# Patient Record
Sex: Female | Born: 1970 | Race: Asian | Marital: Married | Smoking: Former smoker
Health system: Southern US, Community
[De-identification: ages and names within clinical notes are randomized; demographics above are authoritative.]

## PROBLEM LIST (undated history)

## (undated) HISTORY — PX: TUBAL LIGATION: SHX77

---

## 2016-02-23 ENCOUNTER — Encounter (HOSPITAL_COMMUNITY): Payer: Self-pay | Admitting: *Deleted

## 2016-02-24 ENCOUNTER — Other Ambulatory Visit (HOSPITAL_COMMUNITY): Payer: Self-pay | Admitting: *Deleted

## 2016-02-24 DIAGNOSIS — N6310 Unspecified lump in the right breast, unspecified quadrant: Secondary | ICD-10-CM

## 2016-02-24 DIAGNOSIS — N644 Mastodynia: Secondary | ICD-10-CM

## 2016-03-10 ENCOUNTER — Ambulatory Visit
Admission: RE | Admit: 2016-03-10 | Discharge: 2016-03-10 | Disposition: A | Discharge status: Home / Self Care | Payer: No Typology Code available for payment source | Source: Ambulatory Visit | Attending: Obstetrics and Gynecology | Admitting: Obstetrics and Gynecology

## 2016-03-10 ENCOUNTER — Encounter (HOSPITAL_COMMUNITY): Payer: Self-pay | Admitting: *Deleted

## 2016-03-10 ENCOUNTER — Ambulatory Visit (HOSPITAL_COMMUNITY)
Admission: RE | Admit: 2016-03-10 | Discharge: 2016-03-10 | Disposition: A | Discharge status: Home / Self Care | Payer: Self-pay | Source: Ambulatory Visit | Attending: Obstetrics and Gynecology | Admitting: Obstetrics and Gynecology

## 2016-03-10 VITALS — BP 118/86 | Temp 98.6°F | Ht 62.0 in | Wt 141.8 lb

## 2016-03-10 DIAGNOSIS — N644 Mastodynia: Secondary | ICD-10-CM

## 2016-03-10 DIAGNOSIS — N6310 Unspecified lump in the right breast, unspecified quadrant: Secondary | ICD-10-CM

## 2016-03-10 DIAGNOSIS — Z01419 Encounter for gynecological examination (general) (routine) without abnormal findings: Secondary | ICD-10-CM

## 2016-03-10 NOTE — Patient Instructions (Signed)
Explained breast self awareness to Claire Fernandez. Let patient know [facility] will cover Pap smears and HPV typing every 5 years unless has a history of abnormal Pap smears. [facility] and possible right breast ultrasound. Appointment scheduled for Thursday, March 10, 2016 at 0910. Let patient know will call her within the next couple of weeks with results to Pap smear. Claire Evertsheresa Mcfarren verbalized understanding.  Jaryiah Mehlman, Kathaleen Maserhristine Poll, RN 9:17 AM

## 2016-03-10 NOTE — Progress Notes (Signed)
Complaints of right breast lump and pain x 1 year. Patient stated when the pain started it would come and go. Patient stated within the last 6 months the pain has become constant. Patient rates the pain at a 8 out of 10.  Pap Smear: Pap smear completed today. Last Pap smear was in 2012 and normal per patient. Per patient has no history of an abnormal Pap smear. No Pap smear results are in EPIC.   Physical exam: Breasts Breasts symmetrical. No skin abnormalities bilateral breasts. No nipple retraction bilateral breasts. No nipple discharge bilateral breasts. No lymphadenopathy. No lumps palpated bilateral breasts. No lump palpated in patients area of concern. Complaints of right outer breast tenderness and pain at 10 o'clock on exam. [facility] for a diagnostic mamm[facility] and possible right breast ultrasound. Appointment scheduled for Thursday, March 10, 2016 at 0910.  Pelvic/Bimanual   Ext Genitalia No lesions, no swelling and no discharge observed on external genitalia.         Vagina Vagina pink and normal texture. No lesions or discharge observed in vagina.          Cervix Cervix is present. Cervix pink and normal texture with one rough brownish area at 2 o'clock. No discharge observed.     Uterus Uterus is present and palpable. Uterus in normal position and normal size.        Adnexae Bilateral ovaries present and palpable. No tenderness on palpation.          Rectovaginal No rectal exam completed today since patient had no rectal complaints. No skin abnormalities observed on exam.    Smoking History: Patient has never smoked.  Patient Navigation: Patient education provided. Access to services provided for patient through [facility].

## 2016-03-10 NOTE — Progress Notes (Signed)
Pap Smear Completed 

## 2016-03-11 ENCOUNTER — Encounter (HOSPITAL_COMMUNITY): Payer: Self-pay | Admitting: *Deleted

## 2016-03-14 ENCOUNTER — Telehealth (HOSPITAL_COMMUNITY): Payer: Self-pay | Admitting: *Deleted

## 2016-03-14 LAB — CYTOLOGY - PAP
Diagnosis: NEGATIVE
HPV (WINDOPATH): NOT DETECTED

## 2016-03-14 NOTE — Telephone Encounter (Signed)
Telephoned patient at home number and discussed negative pap smear results. HPV was negative. Next pap smear due in five years. Patient voiced understanding.  

## 2016-09-06 DIAGNOSIS — R7301 Impaired fasting glucose: Secondary | ICD-10-CM | POA: Diagnosis not present

## 2016-09-06 DIAGNOSIS — Z0000 Encounter for general adult medical examination without abnormal findings: Secondary | ICD-10-CM | POA: Diagnosis not present

## 2016-09-06 DIAGNOSIS — Z13220 Encounter for screening for lipoid disorders: Secondary | ICD-10-CM | POA: Diagnosis not present

## 2016-09-06 DIAGNOSIS — Z131 Encounter for screening for diabetes mellitus: Secondary | ICD-10-CM | POA: Diagnosis not present

## 2016-11-01 DIAGNOSIS — Z23 Encounter for immunization: Secondary | ICD-10-CM | POA: Diagnosis not present

## 2016-11-01 DIAGNOSIS — E1165 Type 2 diabetes mellitus with hyperglycemia: Secondary | ICD-10-CM | POA: Diagnosis not present

## 2016-11-03 ENCOUNTER — Ambulatory Visit: Payer: Self-pay | Admitting: Podiatry

## 2016-11-04 ENCOUNTER — Ambulatory Visit (INDEPENDENT_AMBULATORY_CARE_PROVIDER_SITE_OTHER): Payer: PRIVATE HEALTH INSURANCE | Admitting: Podiatry

## 2016-11-04 ENCOUNTER — Encounter: Payer: Self-pay | Admitting: Podiatry

## 2016-11-04 VITALS — Resp 16 | Ht 62.0 in | Wt 133.0 lb

## 2016-11-04 DIAGNOSIS — L600 Ingrowing nail: Secondary | ICD-10-CM | POA: Diagnosis not present

## 2016-11-04 NOTE — Progress Notes (Signed)
   Subjective:    Patient ID: Claire Fernandez, female    DOB: 03/31/1970, 46 y.o.   MRN: [id number]  HPI Chief Complaint  Patient presents with  . Nail Problem    Right foot; great toe-medial side; pt stated, "Hurts to walk in shoes"; pt Diabetic Type 2; Sugar=204 this am; A1C=Can't remember      Review of Systems  All other systems reviewed and are negative.      Objective:   Physical Exam        Assessment & Plan:

## 2016-11-04 NOTE — Progress Notes (Signed)
Subjective:    Patient ID: Claire Fernandez, female   DOB: 46 y.o.   MRN: [id number]   HPI patient presents stating she's had very painful ingrown toenails that have been present for years and getting worse over the last few years. She's just been diagnosed with diabetes and her sugar continues to improve and she is not having any issues with the current    Review of Systems  All other systems reviewed and are negative.       Objective:  Physical Exam  Constitutional: She appears well-developed and well-nourished.  Cardiovascular: Intact distal pulses.   Pulmonary/Chest: Breath sounds normal.  Musculoskeletal: Normal range of motion.  Neurological: She is alert.  Skin: Skin is warm.  Nursing note and vitals reviewed.  neurovascular status intact muscle strength adequate range of motion within normal limits with patient noted to have severely damaged hallux nails of both feet with pain when I pressed dorsally and movement of the nailbeds with distal redness but no active drainage noted. Found have good digital perfusion and well oriented 3     Assessment:   Severely damaged hallux nails bilateral with pain irritation and no indication of infection      Plan:    H&P condition reviewed with patient and caregiver. I do think long-term nail removal is indicated and they're motivated to have this done due to the pain and wants surgery. At this point I allowed patient to review what we would do and I discussed complications and healing issues associated with nail removal and they are willing to accept risk. I infiltrated each hallux 60 Milligan's I can Marcaine mixture remove the hallux nails exposed matrix and applied phenol 5 applications 30 seconds followed by alcohol lavaged sterile dressing. Gave instructions on soaks and reappoint

## 2016-11-04 NOTE — Patient Instructions (Signed)

## 2017-01-02 DIAGNOSIS — E1165 Type 2 diabetes mellitus with hyperglycemia: Secondary | ICD-10-CM | POA: Diagnosis not present

## 2017-01-02 DIAGNOSIS — J309 Allergic rhinitis, unspecified: Secondary | ICD-10-CM | POA: Diagnosis not present

## 2017-01-02 DIAGNOSIS — E7800 Pure hypercholesterolemia, unspecified: Secondary | ICD-10-CM | POA: Diagnosis not present

## 2017-02-22 ENCOUNTER — Other Ambulatory Visit: Payer: Self-pay | Admitting: Family Medicine

## 2017-02-22 DIAGNOSIS — Z1231 Encounter for screening mammogram for malignant neoplasm of breast: Secondary | ICD-10-CM

## 2017-03-22 ENCOUNTER — Ambulatory Visit: Payer: No Typology Code available for payment source

## 2017-05-09 DIAGNOSIS — M79642 Pain in left hand: Secondary | ICD-10-CM | POA: Diagnosis not present

## 2017-05-15 DIAGNOSIS — M65332 Trigger finger, left middle finger: Secondary | ICD-10-CM | POA: Diagnosis not present

## 2017-06-01 DIAGNOSIS — H3552 Pigmentary retinal dystrophy: Secondary | ICD-10-CM | POA: Diagnosis not present

## 2017-06-01 DIAGNOSIS — E119 Type 2 diabetes mellitus without complications: Secondary | ICD-10-CM | POA: Diagnosis not present

## 2017-06-01 DIAGNOSIS — H25013 Cortical age-related cataract, bilateral: Secondary | ICD-10-CM | POA: Diagnosis not present

## 2017-06-01 DIAGNOSIS — H524 Presbyopia: Secondary | ICD-10-CM | POA: Diagnosis not present

## 2017-06-01 DIAGNOSIS — H2513 Age-related nuclear cataract, bilateral: Secondary | ICD-10-CM | POA: Diagnosis not present

## 2017-08-18 ENCOUNTER — Ambulatory Visit: Payer: PRIVATE HEALTH INSURANCE | Admitting: Podiatry

## 2017-10-04 DIAGNOSIS — J309 Allergic rhinitis, unspecified: Secondary | ICD-10-CM | POA: Diagnosis not present

## 2017-10-04 DIAGNOSIS — Z0000 Encounter for general adult medical examination without abnormal findings: Secondary | ICD-10-CM | POA: Diagnosis not present

## 2017-10-04 DIAGNOSIS — E1165 Type 2 diabetes mellitus with hyperglycemia: Secondary | ICD-10-CM | POA: Diagnosis not present

## 2017-10-04 DIAGNOSIS — E7800 Pure hypercholesterolemia, unspecified: Secondary | ICD-10-CM | POA: Diagnosis not present

## 2018-01-16 DIAGNOSIS — E1165 Type 2 diabetes mellitus with hyperglycemia: Secondary | ICD-10-CM | POA: Diagnosis not present

## 2018-02-05 ENCOUNTER — Encounter: Payer: Self-pay | Admitting: Podiatry

## 2018-02-05 ENCOUNTER — Ambulatory Visit: Payer: PRIVATE HEALTH INSURANCE | Admitting: Podiatry

## 2018-02-05 DIAGNOSIS — L600 Ingrowing nail: Secondary | ICD-10-CM

## 2018-02-05 MED ORDER — NEOMYCIN-POLYMYXIN-HC 3.5-10000-1 OT SOLN
OTIC | 1 refills | Status: DC
Start: 2018-02-05 — End: 2018-02-05

## 2018-02-05 MED ORDER — NEOMYCIN-POLYMYXIN-HC 3.5-10000-1 OT SOLN: OTIC | 1 refills | Status: AC

## 2018-02-05 NOTE — Patient Instructions (Signed)

## 2018-02-08 NOTE — Progress Notes (Signed)
Subjective:   Patient ID: Claire Fernandez, female   DOB: 47 y.o.   MRN: [id number]   HPI Patient states overall has done well with nails but does have some thick small areas which can become spicule formers and painful at times   ROS      Objective:  Physical Exam  Neurovascular status intact with patient found to have small spicules on each big toenail which have incurvated and thickened and can cause some discomfort with shoe gear     Assessment:  Small regrowth of nail beds hallux bilateral secondary to previous nail surgery     Plan:  I recommend removal of the nail spicules explained procedure to patient and patient wants this done.  She signed consent form after review understanding complications and today I infiltrated each hallux 60 mg like Marcaine mixture using sterile his mentation I remove the spicules exposed matrix and applied phenol 3 applications 30 seconds followed by alcohol lavage and sterile dressing.  Gave instructions on soaks applied dressing and explained what to do if any throbbing were to occur and if not leave them on 24 hours and also wrote prescription for Corticosporin otic solution.  Reappoint to reevaluate

## 2018-02-14 ENCOUNTER — Encounter: Payer: Self-pay | Admitting: Podiatry

## 2018-04-13 DIAGNOSIS — E1169 Type 2 diabetes mellitus with other specified complication: Secondary | ICD-10-CM | POA: Diagnosis not present

## 2018-04-13 DIAGNOSIS — E7800 Pure hypercholesterolemia, unspecified: Secondary | ICD-10-CM | POA: Diagnosis not present

## 2018-04-13 DIAGNOSIS — I10 Essential (primary) hypertension: Secondary | ICD-10-CM | POA: Diagnosis not present

## 2018-06-04 DIAGNOSIS — E119 Type 2 diabetes mellitus without complications: Secondary | ICD-10-CM | POA: Diagnosis not present

## 2018-06-04 DIAGNOSIS — H2513 Age-related nuclear cataract, bilateral: Secondary | ICD-10-CM | POA: Diagnosis not present

## 2018-06-04 DIAGNOSIS — H25013 Cortical age-related cataract, bilateral: Secondary | ICD-10-CM | POA: Diagnosis not present

## 2018-06-04 DIAGNOSIS — H3552 Pigmentary retinal dystrophy: Secondary | ICD-10-CM | POA: Diagnosis not present

## 2018-07-17 DIAGNOSIS — E1169 Type 2 diabetes mellitus with other specified complication: Secondary | ICD-10-CM | POA: Diagnosis not present

## 2018-09-30 IMAGING — MG 2D DIGITAL DIAGNOSTIC BILATERAL MAMMOGRAM WITH CAD AND ADJUNCT T
9 of 15 series · 9 of 35 positions shown · non-contrast
Comparison: None

CLINICAL DATA: Patient presents with focal area of pain in the
upper outer right breast. No reported lump.

EXAM:
2D DIGITAL DIAGNOSTIC BILATERAL MAMMOGRAM WITH CAD AND ADJUNCT TOMO
ULTRASOUND RIGHT BREAST

[R TAN]
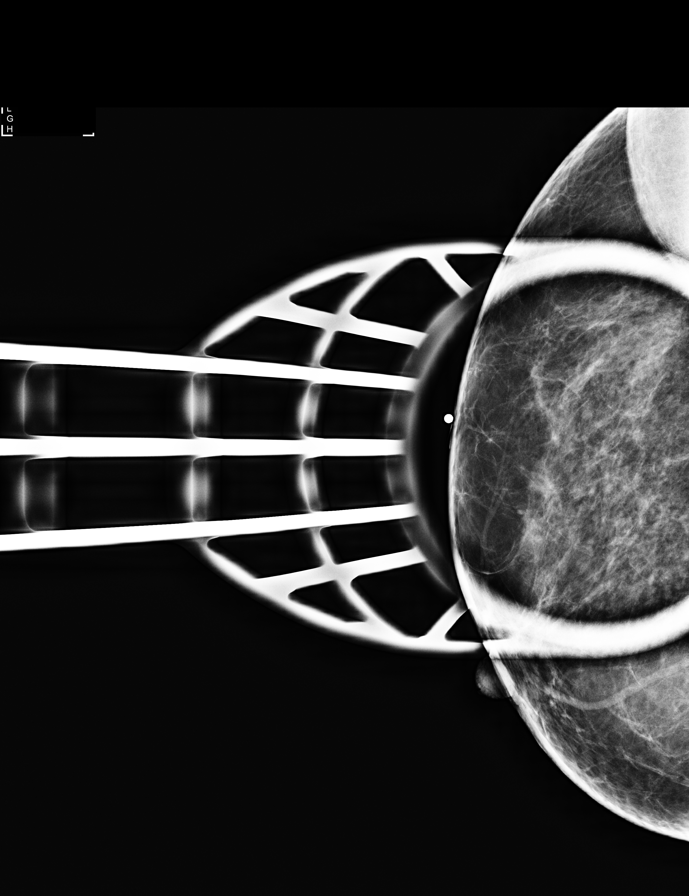

[R CC synth-2D]
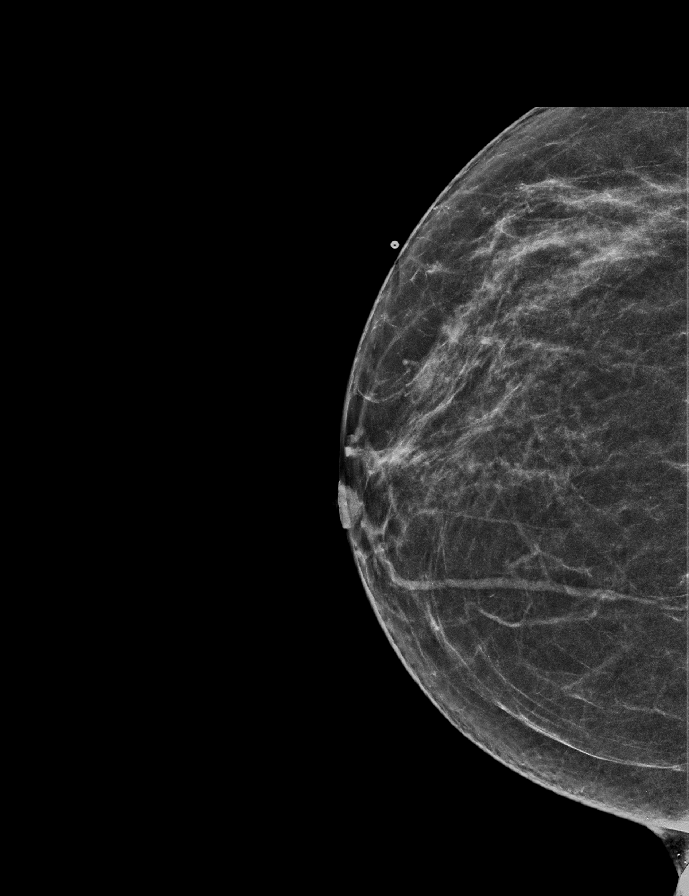

[R MLO]
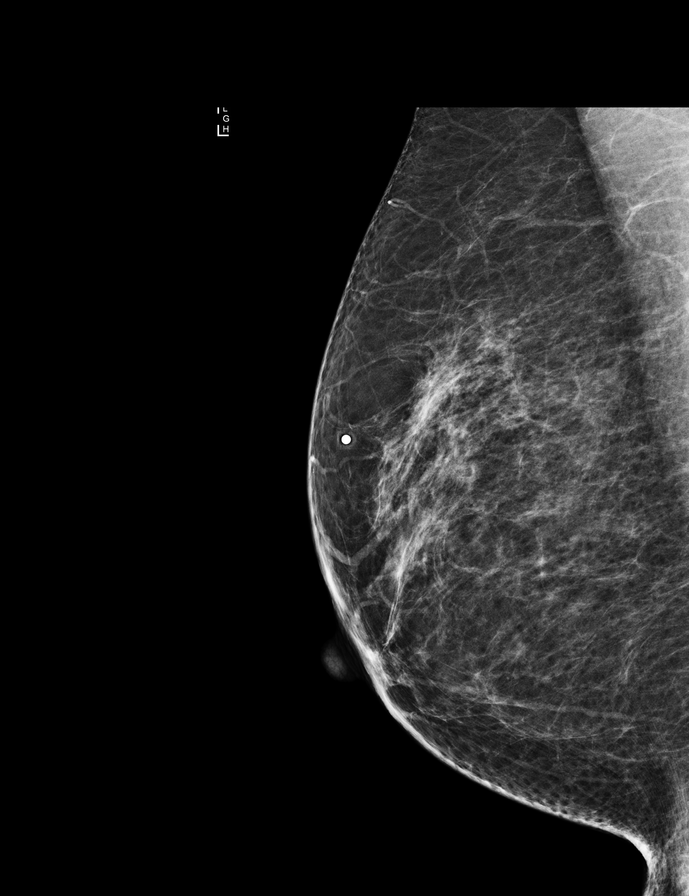

[L MLO synth-2D]
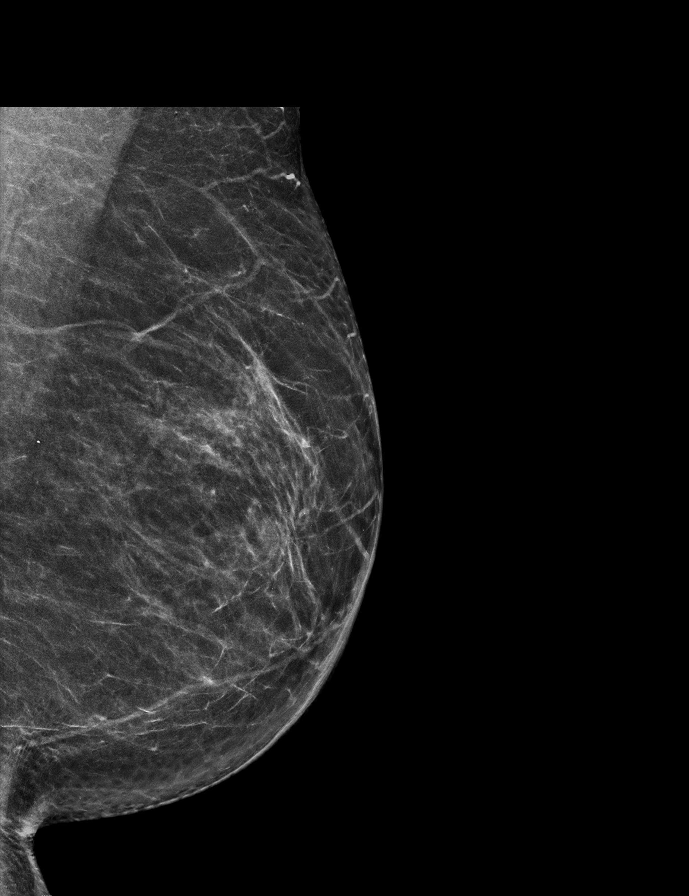

[L CC]
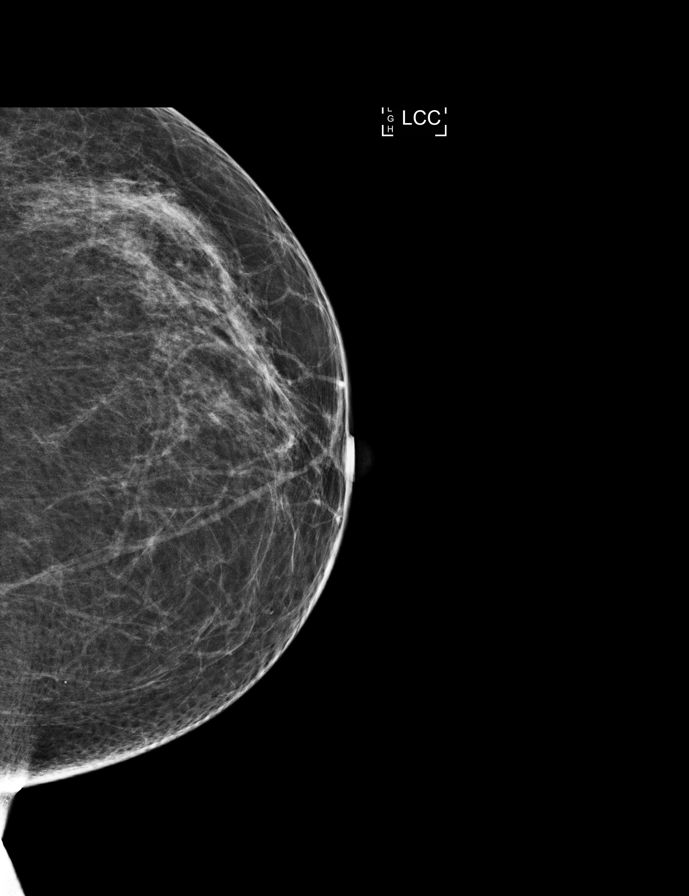

[L CC synth-2D]
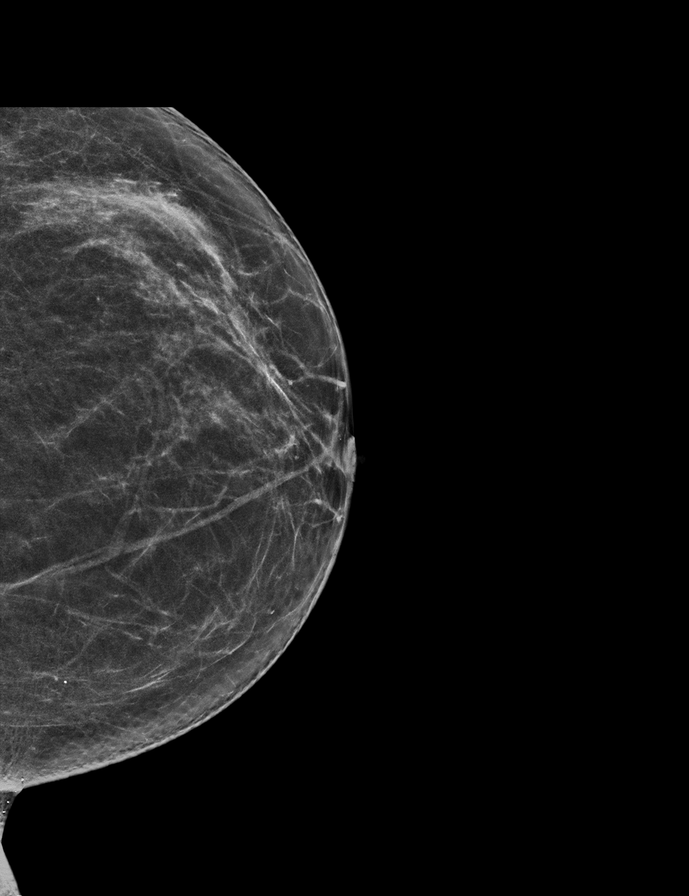

[R CC]
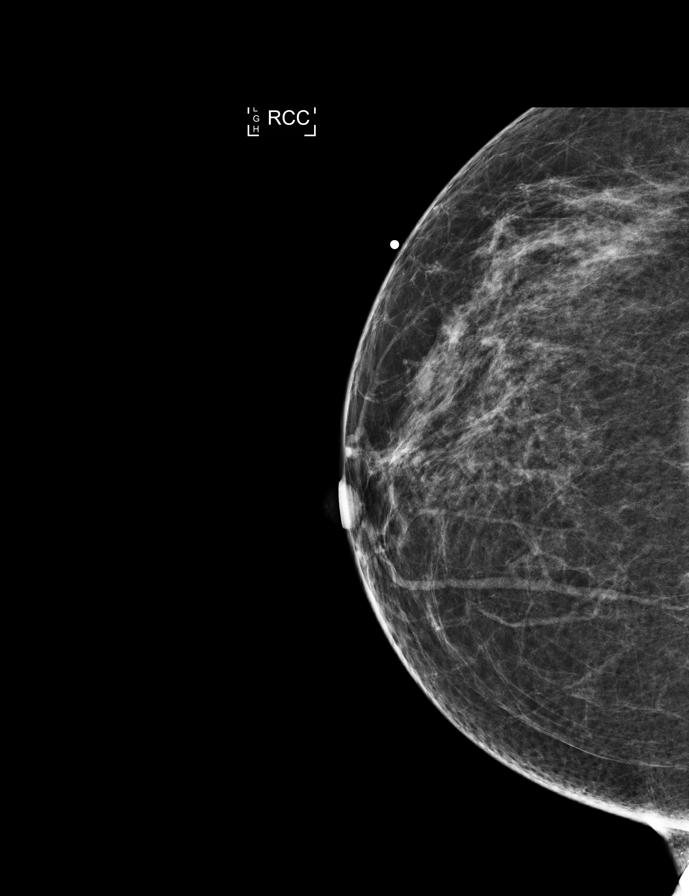

[R TAN synth-2D]
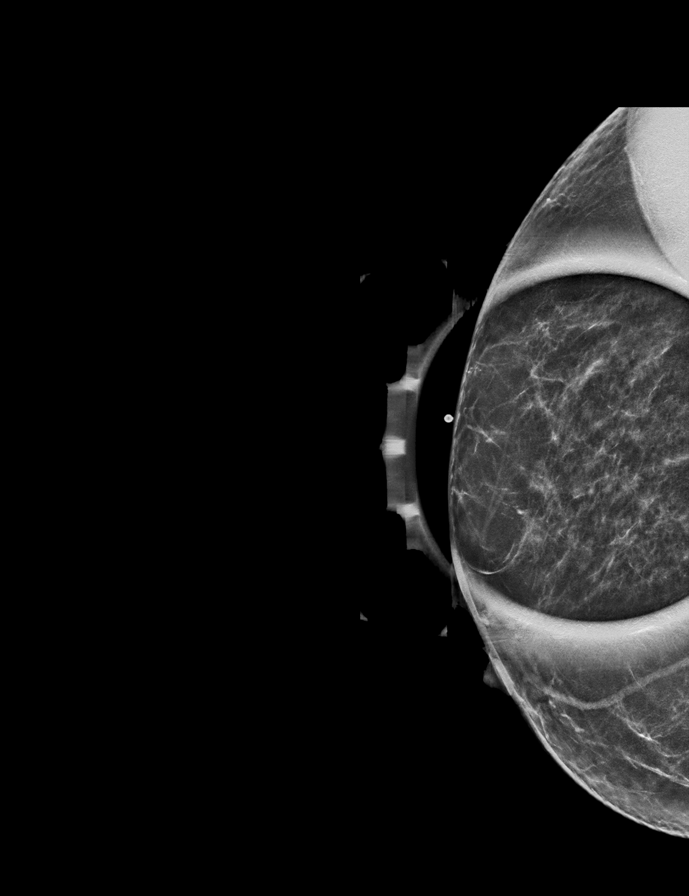

[L MLO]
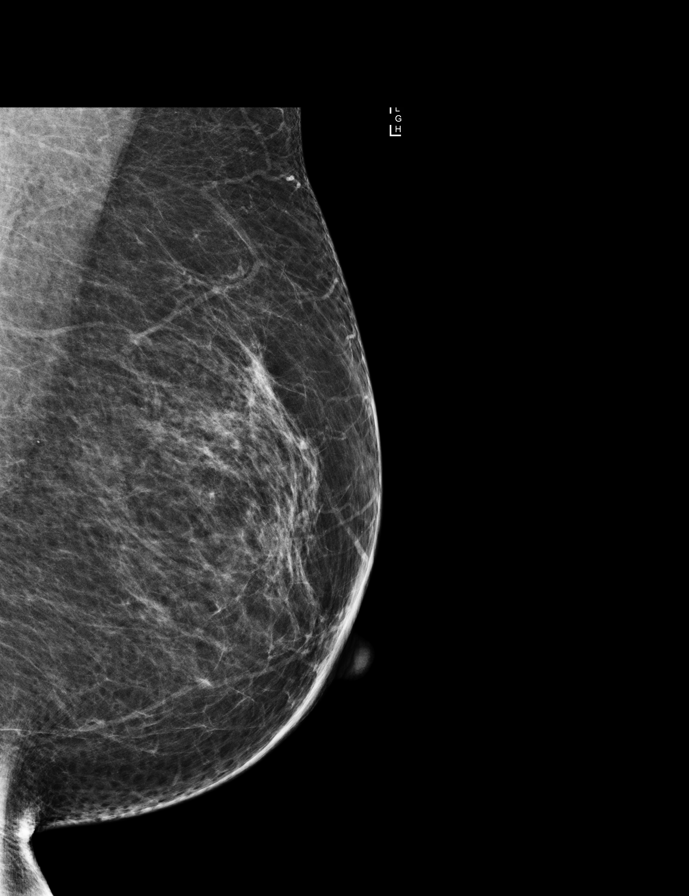

[9 of 35 positions shown; findings below may reference images not displayed]

ACR Breast Density Category b: There are scattered areas of
fibroglandular density.
FINDINGS: There are no discrete masses, areas of architectural distortion,
areas of significant asymmetry or suspicious calcifications.

Mammographic images were processed with CAD.

Targeted ultrasound is performed, showing normal fibroglandular
tissue. No mass, cyst or abnormality in the upper outer right breast
corresponding to the area focal pain.
IMPRESSION: Normal exam.  No evidence of malignancy.

RECOMMENDATION:
Screening mammogram in one year.(Code:[id number])

I have discussed the findings and recommendations with the patient.
Results were also provided in writing at the conclusion of the
visit. If applicable, a reminder letter will be sent to the patient
regarding the next appointment.

BI-RADS CATEGORY  1: Negative.

## 2018-11-12 DIAGNOSIS — Z0000 Encounter for general adult medical examination without abnormal findings: Secondary | ICD-10-CM | POA: Diagnosis not present

## 2018-11-20 DIAGNOSIS — E1169 Type 2 diabetes mellitus with other specified complication: Secondary | ICD-10-CM | POA: Diagnosis not present

## 2018-12-08 DIAGNOSIS — Z23 Encounter for immunization: Secondary | ICD-10-CM | POA: Diagnosis not present

## 2019-02-28 DIAGNOSIS — Z20828 Contact with and (suspected) exposure to other viral communicable diseases: Secondary | ICD-10-CM | POA: Diagnosis not present

## 2019-03-05 DIAGNOSIS — Z20828 Contact with and (suspected) exposure to other viral communicable diseases: Secondary | ICD-10-CM | POA: Diagnosis not present

## 2019-03-12 DIAGNOSIS — Z20828 Contact with and (suspected) exposure to other viral communicable diseases: Secondary | ICD-10-CM | POA: Diagnosis not present

## 2019-03-18 DIAGNOSIS — Z20828 Contact with and (suspected) exposure to other viral communicable diseases: Secondary | ICD-10-CM | POA: Diagnosis not present

## 2019-03-25 DIAGNOSIS — Z20828 Contact with and (suspected) exposure to other viral communicable diseases: Secondary | ICD-10-CM | POA: Diagnosis not present

## 2019-04-02 DIAGNOSIS — Z20828 Contact with and (suspected) exposure to other viral communicable diseases: Secondary | ICD-10-CM | POA: Diagnosis not present

## 2019-04-09 DIAGNOSIS — Z20828 Contact with and (suspected) exposure to other viral communicable diseases: Secondary | ICD-10-CM | POA: Diagnosis not present

## 2019-04-23 DIAGNOSIS — Z20828 Contact with and (suspected) exposure to other viral communicable diseases: Secondary | ICD-10-CM | POA: Diagnosis not present

## 2019-04-30 DIAGNOSIS — Z20828 Contact with and (suspected) exposure to other viral communicable diseases: Secondary | ICD-10-CM | POA: Diagnosis not present

## 2019-05-30 DIAGNOSIS — E7800 Pure hypercholesterolemia, unspecified: Secondary | ICD-10-CM | POA: Diagnosis not present

## 2019-05-30 DIAGNOSIS — I10 Essential (primary) hypertension: Secondary | ICD-10-CM | POA: Diagnosis not present

## 2019-05-30 DIAGNOSIS — E1169 Type 2 diabetes mellitus with other specified complication: Secondary | ICD-10-CM | POA: Diagnosis not present

## 2019-07-09 DIAGNOSIS — H25013 Cortical age-related cataract, bilateral: Secondary | ICD-10-CM | POA: Diagnosis not present

## 2019-07-09 DIAGNOSIS — H2513 Age-related nuclear cataract, bilateral: Secondary | ICD-10-CM | POA: Diagnosis not present

## 2019-07-09 DIAGNOSIS — H04123 Dry eye syndrome of bilateral lacrimal glands: Secondary | ICD-10-CM | POA: Diagnosis not present

## 2019-07-09 DIAGNOSIS — E119 Type 2 diabetes mellitus without complications: Secondary | ICD-10-CM | POA: Diagnosis not present

## 2019-11-21 ENCOUNTER — Other Ambulatory Visit: Payer: Self-pay | Admitting: Family Medicine

## 2019-11-21 ENCOUNTER — Other Ambulatory Visit (HOSPITAL_COMMUNITY)
Admission: RE | Admit: 2019-11-21 | Discharge: 2019-11-21 | Disposition: A | Discharge status: Home / Self Care | Payer: BC Managed Care – PPO | Source: Ambulatory Visit | Attending: Family Medicine | Admitting: Family Medicine

## 2019-11-21 DIAGNOSIS — E1169 Type 2 diabetes mellitus with other specified complication: Secondary | ICD-10-CM | POA: Diagnosis not present

## 2019-11-21 DIAGNOSIS — Z0000 Encounter for general adult medical examination without abnormal findings: Secondary | ICD-10-CM | POA: Diagnosis not present

## 2019-11-21 DIAGNOSIS — I10 Essential (primary) hypertension: Secondary | ICD-10-CM | POA: Diagnosis not present

## 2019-11-21 DIAGNOSIS — Z124 Encounter for screening for malignant neoplasm of cervix: Secondary | ICD-10-CM | POA: Insufficient documentation

## 2019-11-21 DIAGNOSIS — E7800 Pure hypercholesterolemia, unspecified: Secondary | ICD-10-CM | POA: Diagnosis not present

## 2019-11-25 LAB — CYTOLOGY - PAP: Diagnosis: NEGATIVE

## 2020-02-01 DIAGNOSIS — Z23 Encounter for immunization: Secondary | ICD-10-CM | POA: Diagnosis not present

## 2020-02-03 DIAGNOSIS — M25561 Pain in right knee: Secondary | ICD-10-CM | POA: Diagnosis not present

## 2020-02-28 DIAGNOSIS — E1169 Type 2 diabetes mellitus with other specified complication: Secondary | ICD-10-CM | POA: Diagnosis not present

## 2020-04-06 DIAGNOSIS — M25561 Pain in right knee: Secondary | ICD-10-CM | POA: Diagnosis not present

## 2020-05-12 DIAGNOSIS — K640 First degree hemorrhoids: Secondary | ICD-10-CM | POA: Diagnosis not present

## 2020-05-12 DIAGNOSIS — Z1211 Encounter for screening for malignant neoplasm of colon: Secondary | ICD-10-CM | POA: Diagnosis not present

## 2020-05-25 DIAGNOSIS — I10 Essential (primary) hypertension: Secondary | ICD-10-CM | POA: Diagnosis not present

## 2020-05-25 DIAGNOSIS — E1169 Type 2 diabetes mellitus with other specified complication: Secondary | ICD-10-CM | POA: Diagnosis not present

## 2020-05-25 DIAGNOSIS — E7800 Pure hypercholesterolemia, unspecified: Secondary | ICD-10-CM | POA: Diagnosis not present

## 2020-06-22 DIAGNOSIS — E1169 Type 2 diabetes mellitus with other specified complication: Secondary | ICD-10-CM | POA: Diagnosis not present

## 2020-11-23 DIAGNOSIS — Z0000 Encounter for general adult medical examination without abnormal findings: Secondary | ICD-10-CM | POA: Diagnosis not present

## 2020-11-23 DIAGNOSIS — B070 Plantar wart: Secondary | ICD-10-CM | POA: Diagnosis not present

## 2020-11-23 DIAGNOSIS — E7800 Pure hypercholesterolemia, unspecified: Secondary | ICD-10-CM | POA: Diagnosis not present

## 2020-11-23 DIAGNOSIS — E1169 Type 2 diabetes mellitus with other specified complication: Secondary | ICD-10-CM | POA: Diagnosis not present

## 2020-11-23 DIAGNOSIS — I10 Essential (primary) hypertension: Secondary | ICD-10-CM | POA: Diagnosis not present

## 2020-12-07 DIAGNOSIS — Z23 Encounter for immunization: Secondary | ICD-10-CM | POA: Diagnosis not present

## 2021-01-09 DIAGNOSIS — Z23 Encounter for immunization: Secondary | ICD-10-CM | POA: Diagnosis not present

## 2021-02-16 DIAGNOSIS — B070 Plantar wart: Secondary | ICD-10-CM | POA: Diagnosis not present

## 2021-02-16 DIAGNOSIS — Z23 Encounter for immunization: Secondary | ICD-10-CM | POA: Diagnosis not present

## 2021-04-13 DIAGNOSIS — B079 Viral wart, unspecified: Secondary | ICD-10-CM | POA: Diagnosis not present

## 2021-05-11 DIAGNOSIS — Z1231 Encounter for screening mammogram for malignant neoplasm of breast: Secondary | ICD-10-CM | POA: Diagnosis not present

## 2021-05-24 DIAGNOSIS — E7800 Pure hypercholesterolemia, unspecified: Secondary | ICD-10-CM | POA: Diagnosis not present

## 2021-05-24 DIAGNOSIS — B070 Plantar wart: Secondary | ICD-10-CM | POA: Diagnosis not present

## 2021-05-24 DIAGNOSIS — E1169 Type 2 diabetes mellitus with other specified complication: Secondary | ICD-10-CM | POA: Diagnosis not present

## 2021-05-24 DIAGNOSIS — I10 Essential (primary) hypertension: Secondary | ICD-10-CM | POA: Diagnosis not present

## 2021-11-30 DIAGNOSIS — E7800 Pure hypercholesterolemia, unspecified: Secondary | ICD-10-CM | POA: Diagnosis not present

## 2021-11-30 DIAGNOSIS — E1169 Type 2 diabetes mellitus with other specified complication: Secondary | ICD-10-CM | POA: Diagnosis not present

## 2021-11-30 DIAGNOSIS — Z0000 Encounter for general adult medical examination without abnormal findings: Secondary | ICD-10-CM | POA: Diagnosis not present

## 2021-11-30 DIAGNOSIS — I10 Essential (primary) hypertension: Secondary | ICD-10-CM | POA: Diagnosis not present

## 2021-11-30 DIAGNOSIS — B070 Plantar wart: Secondary | ICD-10-CM | POA: Diagnosis not present

## 2021-12-01 DIAGNOSIS — E1169 Type 2 diabetes mellitus with other specified complication: Secondary | ICD-10-CM | POA: Diagnosis not present

## 2021-12-01 DIAGNOSIS — I10 Essential (primary) hypertension: Secondary | ICD-10-CM | POA: Diagnosis not present

## 2021-12-01 DIAGNOSIS — E7800 Pure hypercholesterolemia, unspecified: Secondary | ICD-10-CM | POA: Diagnosis not present

## 2021-12-01 DIAGNOSIS — R002 Palpitations: Secondary | ICD-10-CM | POA: Diagnosis not present

## 2021-12-06 ENCOUNTER — Other Ambulatory Visit: Payer: Self-pay | Admitting: Internal Medicine

## 2021-12-06 DIAGNOSIS — Z122 Encounter for screening for malignant neoplasm of respiratory organs: Secondary | ICD-10-CM

## 2021-12-07 DIAGNOSIS — Z23 Encounter for immunization: Secondary | ICD-10-CM | POA: Diagnosis not present

## 2021-12-28 ENCOUNTER — Ambulatory Visit
Admission: RE | Admit: 2021-12-28 | Discharge: 2021-12-28 | Disposition: A | Discharge status: Home / Self Care | Payer: BC Managed Care – PPO | Source: Ambulatory Visit | Attending: Internal Medicine | Admitting: Internal Medicine

## 2021-12-28 DIAGNOSIS — Z122 Encounter for screening for malignant neoplasm of respiratory organs: Secondary | ICD-10-CM

## 2021-12-28 DIAGNOSIS — Z7689 Persons encountering health services in other specified circumstances: Secondary | ICD-10-CM | POA: Diagnosis not present

## 2021-12-28 DIAGNOSIS — Z87891 Personal history of nicotine dependence: Secondary | ICD-10-CM | POA: Diagnosis not present

## 2022-03-07 DIAGNOSIS — E1169 Type 2 diabetes mellitus with other specified complication: Secondary | ICD-10-CM | POA: Diagnosis not present

## 2022-03-07 DIAGNOSIS — E7800 Pure hypercholesterolemia, unspecified: Secondary | ICD-10-CM | POA: Diagnosis not present

## 2022-05-17 DIAGNOSIS — Z1231 Encounter for screening mammogram for malignant neoplasm of breast: Secondary | ICD-10-CM | POA: Diagnosis not present

## 2022-05-31 DIAGNOSIS — I10 Essential (primary) hypertension: Secondary | ICD-10-CM | POA: Diagnosis not present

## 2022-05-31 DIAGNOSIS — E7800 Pure hypercholesterolemia, unspecified: Secondary | ICD-10-CM | POA: Diagnosis not present

## 2022-05-31 DIAGNOSIS — E1169 Type 2 diabetes mellitus with other specified complication: Secondary | ICD-10-CM | POA: Diagnosis not present

## 2022-05-31 DIAGNOSIS — I700 Atherosclerosis of aorta: Secondary | ICD-10-CM | POA: Diagnosis not present

## 2022-06-21 DIAGNOSIS — E113291 Type 2 diabetes mellitus with mild nonproliferative diabetic retinopathy without macular edema, right eye: Secondary | ICD-10-CM | POA: Diagnosis not present

## 2022-06-21 DIAGNOSIS — H11153 Pinguecula, bilateral: Secondary | ICD-10-CM | POA: Diagnosis not present

## 2022-06-21 DIAGNOSIS — H524 Presbyopia: Secondary | ICD-10-CM | POA: Diagnosis not present

## 2022-06-21 DIAGNOSIS — H35362 Drusen (degenerative) of macula, left eye: Secondary | ICD-10-CM | POA: Diagnosis not present

## 2022-06-21 DIAGNOSIS — H25813 Combined forms of age-related cataract, bilateral: Secondary | ICD-10-CM | POA: Diagnosis not present

## 2022-12-06 DIAGNOSIS — B070 Plantar wart: Secondary | ICD-10-CM | POA: Diagnosis not present

## 2022-12-06 DIAGNOSIS — Z124 Encounter for screening for malignant neoplasm of cervix: Secondary | ICD-10-CM | POA: Diagnosis not present

## 2022-12-06 DIAGNOSIS — I10 Essential (primary) hypertension: Secondary | ICD-10-CM | POA: Diagnosis not present

## 2022-12-06 DIAGNOSIS — Z0000 Encounter for general adult medical examination without abnormal findings: Secondary | ICD-10-CM | POA: Diagnosis not present

## 2022-12-06 DIAGNOSIS — E7800 Pure hypercholesterolemia, unspecified: Secondary | ICD-10-CM | POA: Diagnosis not present

## 2022-12-06 DIAGNOSIS — I700 Atherosclerosis of aorta: Secondary | ICD-10-CM | POA: Diagnosis not present

## 2022-12-06 DIAGNOSIS — E1169 Type 2 diabetes mellitus with other specified complication: Secondary | ICD-10-CM | POA: Diagnosis not present

## 2023-01-02 DIAGNOSIS — E113291 Type 2 diabetes mellitus with mild nonproliferative diabetic retinopathy without macular edema, right eye: Secondary | ICD-10-CM | POA: Diagnosis not present

## 2023-05-23 DIAGNOSIS — Z1231 Encounter for screening mammogram for malignant neoplasm of breast: Secondary | ICD-10-CM | POA: Diagnosis not present

## 2023-06-05 DIAGNOSIS — E7800 Pure hypercholesterolemia, unspecified: Secondary | ICD-10-CM | POA: Diagnosis not present

## 2023-06-05 DIAGNOSIS — I700 Atherosclerosis of aorta: Secondary | ICD-10-CM | POA: Diagnosis not present

## 2023-06-05 DIAGNOSIS — I10 Essential (primary) hypertension: Secondary | ICD-10-CM | POA: Diagnosis not present

## 2023-06-05 DIAGNOSIS — Z23 Encounter for immunization: Secondary | ICD-10-CM | POA: Diagnosis not present

## 2023-06-05 DIAGNOSIS — E1169 Type 2 diabetes mellitus with other specified complication: Secondary | ICD-10-CM | POA: Diagnosis not present

## 2023-06-28 ENCOUNTER — Telehealth: Payer: Self-pay

## 2023-06-28 DIAGNOSIS — Z122 Encounter for screening for malignant neoplasm of respiratory organs: Secondary | ICD-10-CM

## 2023-06-28 DIAGNOSIS — Z87891 Personal history of nicotine dependence: Secondary | ICD-10-CM

## 2023-06-28 NOTE — Telephone Encounter (Signed)
.  Lung Cancer Screening Narrative/Criteria Questionnaire (Cigarette Smokers Only- No Cigars/Pipes/vapes)   Nyasha Rahilly   SDMV:07/31/2023 with Orpha Bur        24-Feb-1971   LDCT: 08/01/2023 at 12:00 pm at [facility]    53 y.o.   Phone: [phone]  Lung Screening Narrative (confirm age 56-77 yrs Medicare / 50-80 yrs Private pay insurance)   Insurance information:[place] [employer]   Referring Provider: Jacqulyn Bath, MD   This screening involves an initial phone call with a team member from our program. It is called a shared decision making visit. The initial meeting is required by  insurance and Medicare to make sure you understand the program. This appointment takes about 15-20 minutes to complete. You will complete the screening scan at your scheduled date/time.  This scan takes about 5-10 minutes to complete. You can eat and drink normally before and after the scan.  Criteria questions for Lung Cancer Screening:   Are you a current or former smoker? Former Age began smoking: 24   If you are a former smoker, what year did you quit smoking?Quit 2017 (within 15 yrs)   To calculate your smoking history, I need an accurate estimate of how many packs of cigarettes you smoked per day and for how many years. (Not just the number of PPD you are now smoking)   Years smoking 20 x Packs per day 1 = Pack years 20   (at least 20 pack yrs)   (Make sure they understand that we need to know how much they have smoked in the past, not just the number of PPD they are smoking now)  Do you have a personal history of cancer?  No    Do you have a family history of cancer? Yes  (cancer type and and relative) Aunt breast   Are you coughing up blood?  No  Have you had unexplained weight loss of 15 lbs or more in the last 6 months? No  It looks like you meet all criteria.  When would be a good time for [place] to schedule you for this screening?   Additional information: N/A

## 2023-07-24 DIAGNOSIS — H35362 Drusen (degenerative) of macula, left eye: Secondary | ICD-10-CM | POA: Diagnosis not present

## 2023-07-24 DIAGNOSIS — H524 Presbyopia: Secondary | ICD-10-CM | POA: Diagnosis not present

## 2023-07-24 DIAGNOSIS — H25813 Combined forms of age-related cataract, bilateral: Secondary | ICD-10-CM | POA: Diagnosis not present

## 2023-07-24 DIAGNOSIS — E113291 Type 2 diabetes mellitus with mild nonproliferative diabetic retinopathy without macular edema, right eye: Secondary | ICD-10-CM | POA: Diagnosis not present

## 2023-07-24 DIAGNOSIS — H04123 Dry eye syndrome of bilateral lacrimal glands: Secondary | ICD-10-CM | POA: Diagnosis not present

## 2023-07-31 ENCOUNTER — Encounter: Payer: Self-pay | Admitting: Adult Health

## 2023-07-31 ENCOUNTER — Ambulatory Visit (INDEPENDENT_AMBULATORY_CARE_PROVIDER_SITE_OTHER): Admitting: Adult Health

## 2023-07-31 DIAGNOSIS — Z87891 Personal history of nicotine dependence: Secondary | ICD-10-CM

## 2023-07-31 NOTE — Patient Instructions (Signed)

## 2023-07-31 NOTE — Progress Notes (Signed)
  Virtual Visit via Telephone Note  I connected with Claire Fernandez , 07/31/23 9:18 AM by a telemedicine application and verified that I am speaking with the correct person using two identifiers.  Location: Patient: home Provider: home   I discussed the limitations of evaluation and management by telemedicine and the availability of in person appointments. The patient expressed understanding and agreed to proceed.   Shared Decision Making Visit Lung Cancer Screening Program [phone])   Eligibility: 53 y.o. Pack Years Smoking History Calculation = 20 pack years  (# packs/per year x # years smoked) Recent History of coughing up blood  no Unexplained weight loss? no ( >Than 15 pounds within the last 6 months ) Prior History Lung / other cancer - No (Diagnosis within the last 5 years already requiring surveillance chest CT Scans). Smoking Status Former Smoker Former Smokers: Years since quit: 8 years  Quit Date: 2017  Visit Components: Discussion included one or more decision making aids. YES Discussion included risk/benefits of screening. YES Discussion included potential follow up diagnostic testing for abnormal scans. YES Discussion included meaning and risk of over diagnosis. YES Discussion included meaning and risk of False Positives. YES Discussion included meaning of total radiation exposure. YES  Counseling Included: Importance of adherence to annual lung cancer LDCT screening. YES Impact of comorbidities on ability to participate in the program. YES Ability and willingness to under diagnostic treatment. YES  Smoking Cessation Counseling: Former Smokers:  Discussed the importance of maintaining cigarette abstinence. yes Diagnosis Code: Personal History of Nicotine Dependence. U04.540 Information about tobacco cessation classes and interventions provided to patient. Yes Patient provided with "ticket" for LDCT Scan. yes Written Order for Lung Cancer Screening with LDCT  placed in Epic. Yes (CT Chest Lung Cancer Screening Low Dose W/O CM) [id number]  Z12.2-Screening of respiratory organs Z87.891-Personal history of nicotine dependence   [place] Dose 07/31/23

## 2023-08-01 ENCOUNTER — Ambulatory Visit
Admission: RE | Admit: 2023-08-01 | Discharge: 2023-08-01 | Disposition: A | Discharge status: Home / Self Care | Source: Ambulatory Visit | Attending: Acute Care | Admitting: Acute Care

## 2023-08-01 DIAGNOSIS — Z122 Encounter for screening for malignant neoplasm of respiratory organs: Secondary | ICD-10-CM

## 2023-08-01 DIAGNOSIS — Z87891 Personal history of nicotine dependence: Secondary | ICD-10-CM

## 2023-08-31 ENCOUNTER — Telehealth: Payer: Self-pay | Admitting: *Deleted

## 2023-08-31 DIAGNOSIS — Z122 Encounter for screening for malignant neoplasm of respiratory organs: Secondary | ICD-10-CM

## 2023-08-31 DIAGNOSIS — Z87891 Personal history of nicotine dependence: Secondary | ICD-10-CM

## 2023-08-31 NOTE — Telephone Encounter (Signed)
 Left VM for patient to call regarding LCS CT results. Thyroid ultrasound recommended.

## 2023-09-01 NOTE — Telephone Encounter (Signed)
 Patient called back in and I spoke with her regarding her results. She is aware of her stable lung nodules and will repeat scan in 12 months. Patient has CAD but is on statin. Order placed for annual LDCT. We also discussed the new notation of the 1.8cm thyroid nodule and the recommendations to have a thyroid US . Will reach out to PCP to see if this can be ordered by their office and for follow up. Patient verbalized understanding. All questions were answered at the time of the call.     IMPRESSION: 1. Lung-RADS 2, benign appearance or behavior. Continue annual screening with low-dose chest CT without contrast in 12 months. 2. Age advanced 2 vessel coronary artery calcification. 3. Partially imaged 1.8 cm low-attenuation right thyroid nodule. Recommend thyroid ultrasound. (Ref: J Am Coll Radiol. 2015 Feb;12(2): 143-50). 4.  Aortic atherosclerosis (ICD10-I70.0). 5.  Emphysema (ICD10-J43.9).     Electronically Signed   By: Shearon Denis M.D.   On: 08/25/2023 14:06

## 2023-09-01 NOTE — Telephone Encounter (Signed)
 Returned call from VM. No answer.  Left a VM with our callback number to review LDCT results

## 2023-09-01 NOTE — Addendum Note (Signed)
 Addended by: Veryl Gottron on: 09/01/2023 10:36 AM   Modules accepted: Orders

## 2023-09-19 DIAGNOSIS — I700 Atherosclerosis of aorta: Secondary | ICD-10-CM | POA: Diagnosis not present

## 2023-09-19 DIAGNOSIS — E7800 Pure hypercholesterolemia, unspecified: Secondary | ICD-10-CM | POA: Diagnosis not present

## 2023-09-19 DIAGNOSIS — I10 Essential (primary) hypertension: Secondary | ICD-10-CM | POA: Diagnosis not present

## 2023-09-19 DIAGNOSIS — E1169 Type 2 diabetes mellitus with other specified complication: Secondary | ICD-10-CM | POA: Diagnosis not present

## 2023-12-18 DIAGNOSIS — Z23 Encounter for immunization: Secondary | ICD-10-CM | POA: Diagnosis not present

## 2024-03-12 ENCOUNTER — Other Ambulatory Visit (HOSPITAL_BASED_OUTPATIENT_CLINIC_OR_DEPARTMENT_OTHER): Payer: Self-pay | Admitting: Internal Medicine

## 2024-03-12 DIAGNOSIS — I10 Essential (primary) hypertension: Secondary | ICD-10-CM | POA: Diagnosis not present

## 2024-03-12 DIAGNOSIS — E1159 Type 2 diabetes mellitus with other circulatory complications: Secondary | ICD-10-CM | POA: Diagnosis not present

## 2024-03-12 DIAGNOSIS — E1169 Type 2 diabetes mellitus with other specified complication: Secondary | ICD-10-CM | POA: Diagnosis not present

## 2024-03-12 DIAGNOSIS — E7800 Pure hypercholesterolemia, unspecified: Secondary | ICD-10-CM | POA: Diagnosis not present

## 2024-03-12 DIAGNOSIS — I700 Atherosclerosis of aorta: Secondary | ICD-10-CM | POA: Diagnosis not present

## 2024-03-12 DIAGNOSIS — Z0000 Encounter for general adult medical examination without abnormal findings: Secondary | ICD-10-CM | POA: Diagnosis not present

## 2024-03-12 DIAGNOSIS — J432 Centrilobular emphysema: Secondary | ICD-10-CM | POA: Diagnosis not present

## 2024-03-12 DIAGNOSIS — Z136 Encounter for screening for cardiovascular disorders: Secondary | ICD-10-CM

## 2024-03-12 DIAGNOSIS — I2510 Atherosclerotic heart disease of native coronary artery without angina pectoris: Secondary | ICD-10-CM | POA: Diagnosis not present

## 2024-03-26 ENCOUNTER — Ambulatory Visit (HOSPITAL_BASED_OUTPATIENT_CLINIC_OR_DEPARTMENT_OTHER)
Admission: RE | Admit: 2024-03-26 | Discharge: 2024-03-26 | Disposition: A | Discharge status: Home / Self Care | Payer: Self-pay | Source: Ambulatory Visit | Attending: Internal Medicine | Admitting: Internal Medicine

## 2024-03-26 DIAGNOSIS — Z136 Encounter for screening for cardiovascular disorders: Secondary | ICD-10-CM | POA: Insufficient documentation

## 2024-04-18 ENCOUNTER — Other Ambulatory Visit: Payer: Self-pay | Admitting: Internal Medicine

## 2024-04-18 DIAGNOSIS — E041 Nontoxic single thyroid nodule: Secondary | ICD-10-CM

## 2024-05-20 ENCOUNTER — Other Ambulatory Visit
